# Patient Record
Sex: Male | Born: 1995 | Race: White | Hispanic: No | Marital: Single | Smoking: Current every day smoker
Health system: Southern US, Community
[De-identification: ages and names within clinical notes are randomized; demographics above are authoritative.]

## PROBLEM LIST (undated history)

## (undated) HISTORY — PX: ANKLE SURGERY: SHX546

---
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
## 2013-10-08 ENCOUNTER — Emergency Department: Payer: Self-pay | Admitting: Emergency Medicine

## 2014-01-08 IMAGING — CT CT ABD-PELV W/ CM
1 series · 1 of 1 positions shown · IV contrast (isovue)
Comparison: None

REASON FOR EXAM: (1) right lower quad and right flank pain; (2) see above
COMMENTS:

PROCEDURE:     CT  - CT ABDOMEN / PELVIS  W  - December 07, 2012  [DATE]
RESULT:     History: Right lower quadrant pain
TECHNIQUE: Multiple axial images of the abdomen and pelvis were performed
from the lung bases to the pubic symphysis, with p.o. contrast and with 100
ml of Isovue 370 intravenous contrast.

[Series 1: topogram 1.0 t20s · coronal · 1.0mm · 1.00mm/px · 1 of 1 slices shown]
[im 1/1]
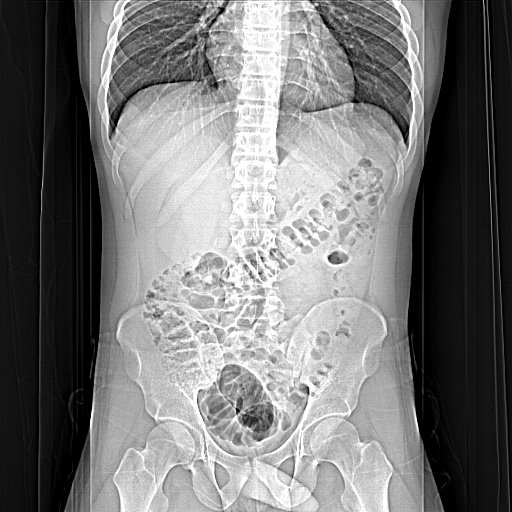

[1 of 1 positions shown; findings below may reference images not displayed]

FINDINGS: The lung bases are clear. There is no pneumothorax. The heart size is
normal.

The liver demonstrates no focal abnormality. There is no intrahepatic or
extrahepatic biliary ductal dilatation. The gallbladder is unremarkable. The
spleen demonstrates no focal abnormality. There is a 2 mm distal right
ureteral calculus just proximal to the UVJ resulting in mild right
hydroureteronephrosis. The left kidney, adrenal glands, and pancreas are
normal. The bladder is unremarkable.

The stomach, duodenum, small intestine, and large intestine demonstrate no
contrast extravasation or dilatation. There is a normal caliber appendix in
the right lower quadrant without periappendiceal inflammatory changes. There
is no pneumoperitoneum, pneumatosis, or portal venous gas. There is no
abdominal or pelvic free fluid. There is no lymphadenopathy.

The abdominal aorta is normal in caliber.

The osseous structures are unremarkable.
IMPRESSION: 1. There is a 2 mm distal right ureteral calculus just proximal to the UVJ
resulting in mild right hydroureteronephrosis.

[REDACTED]

## 2014-06-10 ENCOUNTER — Inpatient Hospital Stay: Payer: Self-pay | Admitting: Psychiatry
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
## 2015-07-07 ENCOUNTER — Emergency Department
Admission: EM | Admit: 2015-07-07 | Discharge: 2015-07-07 | Disposition: A | Discharge status: Home / Self Care | Payer: Self-pay | Attending: Emergency Medicine | Admitting: Emergency Medicine

## 2015-07-07 ENCOUNTER — Encounter: Payer: Self-pay | Admitting: Emergency Medicine

## 2015-07-07 DIAGNOSIS — F410 Panic disorder [episodic paroxysmal anxiety] without agoraphobia: Secondary | ICD-10-CM

## 2015-07-07 DIAGNOSIS — Z23 Encounter for immunization: Secondary | ICD-10-CM | POA: Insufficient documentation

## 2015-07-07 DIAGNOSIS — F1420 Cocaine dependence, uncomplicated: Secondary | ICD-10-CM

## 2015-07-07 DIAGNOSIS — F1520 Other stimulant dependence, uncomplicated: Secondary | ICD-10-CM

## 2015-07-07 DIAGNOSIS — F17200 Nicotine dependence, unspecified, uncomplicated: Secondary | ICD-10-CM

## 2015-07-07 DIAGNOSIS — F19180 Other psychoactive substance abuse with psychoactive substance-induced anxiety disorder: Secondary | ICD-10-CM | POA: Insufficient documentation

## 2015-07-07 DIAGNOSIS — F1910 Other psychoactive substance abuse, uncomplicated: Secondary | ICD-10-CM

## 2015-07-07 DIAGNOSIS — F603 Borderline personality disorder: Secondary | ICD-10-CM

## 2015-07-07 DIAGNOSIS — F1310 Sedative, hypnotic or anxiolytic abuse, uncomplicated: Secondary | ICD-10-CM

## 2015-07-07 DIAGNOSIS — X789XXA Intentional self-harm by unspecified sharp object, initial encounter: Secondary | ICD-10-CM | POA: Insufficient documentation

## 2015-07-07 DIAGNOSIS — F1220 Cannabis dependence, uncomplicated: Secondary | ICD-10-CM

## 2015-07-07 DIAGNOSIS — F12180 Cannabis abuse with cannabis-induced anxiety disorder: Secondary | ICD-10-CM | POA: Insufficient documentation

## 2015-07-07 LAB — URINALYSIS COMPLETE WITH MICROSCOPIC (ARMC ONLY)
BACTERIA UA: NONE SEEN
BILIRUBIN URINE: NEGATIVE
Glucose, UA: NEGATIVE mg/dL
HGB URINE DIPSTICK: NEGATIVE
Ketones, ur: NEGATIVE mg/dL
LEUKOCYTES UA: NEGATIVE
Nitrite: NEGATIVE
PH: 6 (ref 5.0–8.0)
PROTEIN: NEGATIVE mg/dL
Specific Gravity, Urine: 1.029 (ref 1.005–1.030)

## 2015-07-07 LAB — COMPREHENSIVE METABOLIC PANEL
ALT: 10 U/L — AB (ref 17–63)
AST: 15 U/L (ref 15–41)
Albumin: 4.3 g/dL (ref 3.5–5.0)
Alkaline Phosphatase: 74 U/L (ref 38–126)
Anion gap: 8 (ref 5–15)
BUN: 16 mg/dL (ref 6–20)
CHLORIDE: 103 mmol/L (ref 101–111)
CO2: 26 mmol/L (ref 22–32)
Calcium: 9.2 mg/dL (ref 8.9–10.3)
Creatinine, Ser: 0.84 mg/dL (ref 0.61–1.24)
Glucose, Bld: 91 mg/dL (ref 65–99)
POTASSIUM: 3.7 mmol/L (ref 3.5–5.1)
SODIUM: 137 mmol/L (ref 135–145)
Total Bilirubin: 0.5 mg/dL (ref 0.3–1.2)
Total Protein: 7.4 g/dL (ref 6.5–8.1)

## 2015-07-07 LAB — CBC WITH DIFFERENTIAL/PLATELET
BASOS ABS: 0 10*3/uL (ref 0–0.1)
Basophils Relative: 0 %
Eosinophils Absolute: 0.3 10*3/uL (ref 0–0.7)
Eosinophils Relative: 3 %
HEMATOCRIT: 41.1 % (ref 40.0–52.0)
HEMOGLOBIN: 14.3 g/dL (ref 13.0–18.0)
LYMPHS ABS: 2.3 10*3/uL (ref 1.0–3.6)
LYMPHS PCT: 22 %
MCH: 31.3 pg (ref 26.0–34.0)
MCHC: 34.9 g/dL (ref 32.0–36.0)
MCV: 89.5 fL (ref 80.0–100.0)
Monocytes Absolute: 0.9 10*3/uL (ref 0.2–1.0)
Monocytes Relative: 9 %
NEUTROS ABS: 7.1 10*3/uL — AB (ref 1.4–6.5)
Neutrophils Relative %: 66 %
Platelets: 281 10*3/uL (ref 150–440)
RBC: 4.59 MIL/uL (ref 4.40–5.90)
RDW: 13.4 % (ref 11.5–14.5)
WBC: 10.7 10*3/uL — AB (ref 3.8–10.6)

## 2015-07-07 LAB — SALICYLATE LEVEL: Salicylate Lvl: <4 mg/dL (ref 2.8–30.0)

## 2015-07-07 LAB — URINE DRUG SCREEN, QUALITATIVE (ARMC ONLY)
Amphetamines, Ur Screen: POSITIVE — AB
BARBITURATES, UR SCREEN: NOT DETECTED
Benzodiazepine, Ur Scrn: POSITIVE — AB
CANNABINOID 50 NG, UR ~~LOC~~: POSITIVE — AB
Cocaine Metabolite,Ur ~~LOC~~: POSITIVE — AB
MDMA (ECSTASY) UR SCREEN: NOT DETECTED
Methadone Scn, Ur: NOT DETECTED
Opiate, Ur Screen: NOT DETECTED
PHENCYCLIDINE (PCP) UR S: NOT DETECTED
Tricyclic, Ur Screen: NOT DETECTED

## 2015-07-07 LAB — ETHANOL

## 2015-07-07 LAB — ACETAMINOPHEN LEVEL

## 2015-07-07 MED ORDER — TRAZODONE HCL 100 MG PO TABS: 100.0000 mg | ORAL_TABLET | Freq: Every evening | ORAL | Status: AC | PRN

## 2015-07-07 MED ORDER — TETANUS-DIPHTH-ACELL PERTUSSIS 5-2.5-18.5 LF-MCG/0.5 IM SUSP
0.5000 mL | Freq: Once | INTRAMUSCULAR | Status: AC
  Administered 2015-07-07: 0.5 mL via INTRAMUSCULAR
  Filled 2015-07-07: qty 0.5

## 2015-07-07 MED ORDER — SERTRALINE HCL 25 MG PO TABS: 25.0000 mg | ORAL_TABLET | Freq: Every day | ORAL | Status: AC

## 2015-07-07 MED ORDER — HYDROXYZINE PAMOATE 50 MG PO CAPS: 50.0000 mg | ORAL_CAPSULE | Freq: Three times a day (TID) | ORAL | Status: AC | PRN

## 2015-07-07 NOTE — ED Notes (Signed)
Discharge planning in progress.  Waiting for EDP for final discharge orders.Maintained on 15 minute checks and observation by security camera for safety.

## 2015-07-07 NOTE — ED Notes (Signed)

## 2015-07-07 NOTE — ED Notes (Signed)
Patient discharged ambulatory to home. He called his grandmother for a ride. Patient denies SI or HI. Discharge instructions reviewed with staff, he verbalizes understanding. Patient received copy of DC plan, all personal belongings, and was walked to the lobby by nurse.
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
## 2015-07-07 NOTE — ED Provider Notes (Signed)
[facility] Emergency Department Provider Note  ____________________________________________  Time seen: Approximately 330 AM  I have reviewed the triage vital signs and the nursing notes.   HISTORY  Chief Complaint Mental Health Problem    HPI [name] [name]. is a 20 y.o. male who comes into the hospital today with self-inflicted injury. The patient has cuts to his face and his arms. He reports that it occurred around 9 PM. He denies this being a suicide attempt. He brought himself in because he felt it would be the right thing to do. The patient denies any alcohol but was smoking marijuana. He reports that this is the first time that he cut.The patient reports that he is feeling anxious but denies any depression. He denies suicidal or homicidal ideation but he did tell the psych nurse that he cut in an effort to not harm anyone. The patient is here for evaluation.   History reviewed. No pertinent past medical history.  There are no active problems to display for this patient.   Past Surgical History  Procedure Laterality Date  . Ankle surgery Right     No current outpatient prescriptions on file.  Allergies Review of patient's allergies indicates no known allergies.  No family history on file.  Social History Social History  Substance Use Topics  . Smoking status: Current Every Day Smoker  . Smokeless tobacco: None  . Alcohol Use: Yes    Review of Systems Constitutional: No fever/chills Eyes: No visual changes. ENT: No sore throat. Cardiovascular: Denies chest pain. Respiratory: Denies shortness of breath. Gastrointestinal: No abdominal pain.  No nausea, no vomiting.  No diarrhea.  No constipation. Genitourinary: Negative for dysuria. Musculoskeletal: Negative for back pain. Skin: Superficial lacerations Neurological: Negative for headaches, focal weakness or numbness. Psychiatric: Anxiety and cutting.  10-point ROS  otherwise negative.  ____________________________________________   PHYSICAL EXAM:  VITAL SIGNS: ED Triage Vitals  Enc Vitals Group     BP 07/07/15 0242 115/80 mmHg     Pulse Rate 07/07/15 0242 84     Resp 07/07/15 0242 16     Temp 07/07/15 0242 98 F (36.7 C)     Temp Source 07/07/15 0242 Oral     SpO2 07/07/15 0242 99 %     Weight 07/07/15 0242 155 lb (70.308 kg)     Height 07/07/15 0242  (1.753 m)     Head Cir --      Peak Flow --      Pain Score 07/07/15 0244 7     Pain Loc --      Pain Edu? --      Excl. in GC? --     Constitutional: Alert and oriented. Well appearing and in no acute distress. Eyes: Conjunctivae are normal. PERRL. EOMI. Head: Atraumatic. Nose: No congestion/rhinnorhea. Mouth/Throat: Mucous membranes are moist.  Oropharynx non-erythematous. Cardiovascular: Normal rate, regular rhythm. Grossly normal heart sounds.  Good peripheral circulation. Respiratory: Normal respiratory effort.  No retractions. Lungs CTAB. Gastrointestinal: Soft and nontender. No distention. Positive bowel sounds Musculoskeletal: No lower extremity tenderness nor edema.  Neurologic:  Normal speech and language.  Skin:  Skin is warm, dry superficial lacerations to face, chest, arms Psychiatric: Mood and affect are normal. .  ____________________________________________   LABS (all labs ordered are listed, but only abnormal results are displayed)  Labs Reviewed  COMPREHENSIVE METABOLIC PANEL - Abnormal; Notable for the following:    ALT 10 (*)    All other components within  normal limits  CBC WITH DIFFERENTIAL/PLATELET - Abnormal; Notable for the following:    WBC 10.7 (*)    Neutro Abs 7.1 (*)    All other components within normal limits  URINALYSIS COMPLETEWITH MICROSCOPIC ([facility] ONLY) - Abnormal; Notable for the following:    Color, Urine YELLOW (*)    APPearance CLEAR (*)    Squamous Epithelial / LPF 0-5 (*)    All other components within normal limits   ACETAMINOPHEN LEVEL - Abnormal; Notable for the following:    Acetaminophen (Tylenol), Serum <10 (*)    All other components within normal limits  URINE DRUG SCREEN, QUALITATIVE ([facility] ONLY) - Abnormal; Notable for the following:    Amphetamines, Ur Screen POSITIVE (*)    Cocaine Metabolite,Ur [place] POSITIVE (*)    Cannabinoid 50 Ng, Ur [place] POSITIVE (*)    Benzodiazepine, Ur Scrn POSITIVE (*)    All other components within normal limits  ETHANOL  SALICYLATE LEVEL   ____________________________________________  EKG  None ____________________________________________  RADIOLOGY  None ____________________________________________   PROCEDURES  Procedure(s) performed: None  Critical Care performed: No  ____________________________________________   INITIAL IMPRESSION / ASSESSMENT AND PLAN / ED COURSE  Pertinent labs & imaging results that were available during my care of the patient were reviewed by me and considered in my medical decision making (see chart for details).  This is a 20 year old male who comes into the hospital today with feelings of anxiety and self injury. The patient has some superficial lacerations to his right face, chest and arms. The patient denies any suicidal or homicidal ideation but he was using illicit drugs. I will have the patient evaluated by psych and his disposition will be determined by psych and the ED physician at the time. ____________________________________________   FINAL CLINICAL IMPRESSION(S) / ED DIAGNOSES  Final diagnoses:  Deliberate self-cutting  Anxiety      Rebecka ApleyAllison P Crystal Ellwood, MD 07/07/15 [phone]
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
## 2015-07-07 NOTE — ED Notes (Signed)
All belongings searched. Contraband shown to patient and then locked up. All items in locker #5.  Patient spoke with mother on the phone. He is also concerned about his girlfriend who is also in the ED. He was told that we could not give him any info regarding girlfriend. Patient upset and tearful. Maintained on 15 minute checks and observation by security camera for safety.

## 2015-07-07 NOTE — Discharge Instructions (Signed)
You have been seen in the Emergency Department (ED) today for a psychiatric complaint.  You have been evaluated by psychiatry and we believe you are safe to be discharged from the hospital.    Please return to the ED immediately if you have ANY thoughts of hurting yourself or anyone else, so that we may help you.  Please avoid alcohol and drug use.  Use your new prescribed medications as written.  Follow up with your doctor and/or therapist as soon as possible regarding today's ED visit.   Please follow up any other recommendations and clinic appointments provided by the psychiatry team that saw you in the Emergency Department.   Panic Attacks Panic attacks are sudden, short-livedsurges of severe anxiety, fear, or discomfort. They may occur for no reason when you are relaxed, when you are anxious, or when you are sleeping. Panic attacks may occur for a number of reasons:   Healthy people occasionally have panic attacks in extreme, life-threatening situations, such as war or natural disasters. Normal anxiety is a protective mechanism of the body that helps [place] react to danger (fight or flight response).  Panic attacks are often seen with anxiety disorders, such as panic disorder, social anxiety disorder, generalized anxiety disorder, and phobias. Anxiety disorders cause excessive or uncontrollable anxiety. They may interfere with your relationships or other life activities.  Panic attacks are sometimes seen with other mental illnesses, such as depression and posttraumatic stress disorder.  Certain medical conditions, prescription medicines, and drugs of abuse can cause panic attacks. SYMPTOMS  Panic attacks start suddenly, peak within 20 minutes, and are accompanied by four or more of the following symptoms:  Pounding heart or fast heart rate (palpitations).  Sweating.  Trembling or shaking.  Shortness of breath or feeling smothered.  Feeling choked.  Chest pain or  discomfort.  Nausea or strange feeling in your stomach.  Dizziness, light-headedness, or feeling like you will faint.  Chills or hot flushes.  Numbness or tingling in your lips or hands and feet.  Feeling that things are not real or feeling that you are not yourself.  Fear of losing control or going crazy.  Fear of dying. Some of these symptoms can mimic serious medical conditions. For example, you may think you are having a heart attack. Although panic attacks can be very scary, they are not life threatening. DIAGNOSIS  Panic attacks are diagnosed through an assessment by your health care provider. Your health care provider will ask questions about your symptoms, such as where and when they occurred. Your health care provider will also ask about your medical history and use of alcohol and drugs, including prescription medicines. Your health care provider may order blood tests or other studies to rule out a serious medical condition. Your health care provider may refer you to a mental health professional for further evaluation. TREATMENT   Most healthy people who have one or two panic attacks in an extreme, life-threatening situation will not require treatment.  The treatment for panic attacks associated with anxiety disorders or other mental illness typically involves counseling with a mental health professional, medicine, or a combination of both. Your health care provider will help determine what treatment is best for you.  Panic attacks due to physical illness usually go away with treatment of the illness. If prescription medicine is causing panic attacks, talk with your health care provider about stopping the medicine, decreasing the dose, or substituting another medicine.  Panic attacks due to alcohol or drug abuse go  away with abstinence. Some adults need professional help in order to stop drinking or using drugs. HOME CARE INSTRUCTIONS   Take all medicines as directed by your  health care provider.   Schedule and attend follow-up visits as directed by your health care provider. It is important to keep all your appointments. SEEK MEDICAL CARE IF:  You are not able to take your medicines as prescribed.  Your symptoms do not improve or get worse. SEEK IMMEDIATE MEDICAL CARE IF:   You experience panic attack symptoms that are different than your usual symptoms.  You have serious thoughts about hurting yourself or others.  You are taking medicine for panic attacks and have a serious side effect. MAKE SURE YOU:  Understand these instructions.  Will watch your condition.  Will get help right away if you are not doing well or get worse.   This information is not intended to replace advice given to you by your health care provider. Make sure you discuss any questions you have with your health care provider.   Document Released: 03/29/2005 Document Revised: 04/03/2013 Document Reviewed: 11/10/2012 Elsevier Interactive Patient Education 2016 [employer].  Polysubstance Abuse When people abuse more than one drug or type of drug it is called polysubstance or polydrug abuse. For example, many smokers also drink alcohol. This is one form of polydrug abuse. Polydrug abuse also refers to the use of a drug to counteract an unpleasant effect produced by another drug. It may also be used to help with withdrawal from another drug. People who take stimulants may become agitated. Sometimes this agitation is countered with a tranquilizer. This helps protect against the unpleasant side effects. Polydrug abuse also refers to the use of different drugs at the same time.  Anytime drug use is interfering with normal living activities, it has become abuse. This includes problems with family and friends. Psychological dependence has developed when your mind tells you that the drug is needed. This is usually followed by physical dependence which has developed when continuing increases of  drug are required to get the same feeling or "high". This is known as addiction or chemical dependency. A person's risk is much higher if there is a history of chemical dependency in the family. SIGNS OF CHEMICAL DEPENDENCY  You have been told by friends or family that drugs have become a problem.  You fight when using drugs.  You are having blackouts (not remembering what you do while using).  You feel sick from using drugs but continue using.  You lie about use or amounts of drugs (chemicals) used.  You need chemicals to get you going.  You are suffering in work performance or in school because of drug use.  You get sick from use of drugs but continue to use anyway.  You need drugs to relate to people or feel comfortable in social situations.  You use drugs to forget problems. "Yes" answered to any of the above signs of chemical dependency indicates there are problems. The longer the use of drugs continues, the greater the problems will become. If there is a family history of drug or alcohol use, it is best not to experiment with these drugs. Continual use leads to tolerance. After tolerance develops more of the drug is needed to get the same feeling. This is followed by addiction. With addiction, drugs become the most important part of life. It becomes more important to take drugs than participate in the other usual activities of life. This includes relating  to friends and family. Addiction is followed by dependency. Dependency is a condition where drugs are now needed not just to get high, but to feel normal. Addiction cannot be cured but it can be stopped. This often requires outside help and the care of professionals. Treatment centers are listed in the yellow pages under: Cocaine, Narcotics, and Alcoholics Anonymous. Most hospitals and clinics can refer you to a specialized care center. Talk to your caregiver if you need help.   This information is not intended to replace advice  given to you by your health care provider. Make sure you discuss any questions you have with your health care provider.   Document Released: 11/18/2004 Document Revised: 06/21/2011 Document Reviewed: 04/03/2014 Elsevier Interactive Patient Education [employer] Inc[employer]

## 2015-07-07 NOTE — ED Notes (Signed)
Patient resting quietly in room. No noted distress or abnormal behaviors noted. Will continue 15 minute checks and observation by security camera for safety. 
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
## 2015-07-31 ENCOUNTER — Ambulatory Visit (HOSPITAL_COMMUNITY): Payer: Self-pay | Admitting: Psychiatry

## 2017-01-10 DEATH — deceased
